# Patient Record
Sex: Male | Born: 1995 | Hispanic: No | Marital: Single | Smoking: Never smoker
Health system: Southern US, Community
[De-identification: ages and names within clinical notes are randomized; demographics above are authoritative.]

## PROBLEM LIST (undated history)

## (undated) DIAGNOSIS — G43909 Migraine, unspecified, not intractable, without status migrainosus: Secondary | ICD-10-CM

## (undated) DIAGNOSIS — D573 Sickle-cell trait: Secondary | ICD-10-CM

---
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
## 2018-01-07 ENCOUNTER — Emergency Department (HOSPITAL_COMMUNITY): Payer: Medicaid Other

## 2018-01-07 ENCOUNTER — Other Ambulatory Visit: Payer: Self-pay

## 2018-01-07 ENCOUNTER — Emergency Department (HOSPITAL_COMMUNITY)
Admission: EM | Admit: 2018-01-07 | Discharge: 2018-01-07 | Disposition: A | Discharge status: Home / Self Care | Payer: Medicaid Other | Attending: Emergency Medicine | Admitting: Emergency Medicine

## 2018-01-07 ENCOUNTER — Encounter (HOSPITAL_COMMUNITY): Payer: Self-pay | Admitting: Emergency Medicine

## 2018-01-07 DIAGNOSIS — D573 Sickle-cell trait: Secondary | ICD-10-CM | POA: Diagnosis not present

## 2018-01-07 DIAGNOSIS — Y9389 Activity, other specified: Secondary | ICD-10-CM | POA: Insufficient documentation

## 2018-01-07 DIAGNOSIS — Y998 Other external cause status: Secondary | ICD-10-CM | POA: Diagnosis not present

## 2018-01-07 DIAGNOSIS — S199XXA Unspecified injury of neck, initial encounter: Secondary | ICD-10-CM | POA: Diagnosis present

## 2018-01-07 DIAGNOSIS — J45909 Unspecified asthma, uncomplicated: Secondary | ICD-10-CM | POA: Insufficient documentation

## 2018-01-07 DIAGNOSIS — Y92410 Unspecified street and highway as the place of occurrence of the external cause: Secondary | ICD-10-CM | POA: Diagnosis not present

## 2018-01-07 DIAGNOSIS — S161XXA Strain of muscle, fascia and tendon at neck level, initial encounter: Secondary | ICD-10-CM | POA: Diagnosis not present

## 2018-01-07 MED ORDER — CYCLOBENZAPRINE HCL 10 MG PO TABS: 10.0000 mg | ORAL_TABLET | Freq: Three times a day (TID) | ORAL | 0 refills | Status: DC | PRN

## 2018-01-07 MED ORDER — CYCLOBENZAPRINE HCL 10 MG PO TABS
10.0000 mg | ORAL_TABLET | Freq: Once | ORAL | Status: AC
  Administered 2018-01-07: 10 mg via ORAL
  Filled 2018-01-07: qty 1

## 2018-01-07 MED ORDER — IBUPROFEN 800 MG PO TABS: 800.0000 mg | ORAL_TABLET | Freq: Three times a day (TID) | ORAL | 0 refills | Status: DC

## 2018-01-07 MED ORDER — IBUPROFEN 800 MG PO TABS
800.0000 mg | ORAL_TABLET | Freq: Once | ORAL | Status: AC
  Administered 2018-01-07: 800 mg via ORAL
  Filled 2018-01-07: qty 1
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
## 2018-01-07 NOTE — Discharge Instructions (Addendum)
Alternate ice and heat to your neck as needed.  Follow-up with your doctor for recheck in a few days if not improving
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
## 2018-02-25 ENCOUNTER — Encounter (HOSPITAL_COMMUNITY): Payer: Self-pay | Admitting: Emergency Medicine

## 2018-02-25 ENCOUNTER — Emergency Department (HOSPITAL_COMMUNITY)
Admission: EM | Admit: 2018-02-25 | Discharge: 2018-02-25 | Disposition: A | Discharge status: Home / Self Care | Payer: Medicaid Other | Attending: Emergency Medicine | Admitting: Emergency Medicine

## 2018-02-25 ENCOUNTER — Other Ambulatory Visit: Payer: Self-pay

## 2018-02-25 DIAGNOSIS — Z79899 Other long term (current) drug therapy: Secondary | ICD-10-CM | POA: Insufficient documentation

## 2018-02-25 DIAGNOSIS — J45909 Unspecified asthma, uncomplicated: Secondary | ICD-10-CM | POA: Diagnosis not present

## 2018-02-25 DIAGNOSIS — J111 Influenza due to unidentified influenza virus with other respiratory manifestations: Secondary | ICD-10-CM | POA: Insufficient documentation

## 2018-02-25 DIAGNOSIS — R51 Headache: Secondary | ICD-10-CM | POA: Diagnosis present

## 2018-02-25 HISTORY — DX: Migraine, unspecified, not intractable, without status migrainosus: G43.909

## 2018-02-25 MED ORDER — ONDANSETRON HCL 4 MG PO TABS
ORAL_TABLET | ORAL | Status: AC
  Filled 2018-02-25: qty 1

## 2018-02-25 MED ORDER — ONDANSETRON 4 MG PO TBDP: 4.0000 mg | ORAL_TABLET | Freq: Three times a day (TID) | ORAL | 0 refills | Status: DC | PRN

## 2018-02-25 MED ORDER — SODIUM CHLORIDE 0.9 % IV BOLUS
1000.0000 mL | Freq: Once | INTRAVENOUS | Status: AC
  Administered 2018-02-25: 1000 mL via INTRAVENOUS

## 2018-02-25 MED ORDER — ACETAMINOPHEN 325 MG PO TABS: 650.0000 mg | ORAL_TABLET | Freq: Once | ORAL | Status: DC | PRN

## 2018-02-25 MED ORDER — OSELTAMIVIR PHOSPHATE 75 MG PO CAPS: 75.0000 mg | ORAL_CAPSULE | Freq: Two times a day (BID) | ORAL | 0 refills | Status: DC

## 2018-02-25 MED ORDER — ACETAMINOPHEN 325 MG PO TABS
ORAL_TABLET | ORAL | Status: AC
  Administered 2018-02-25: 650 mg
  Filled 2018-02-25: qty 2

## 2018-02-25 MED ORDER — ONDANSETRON 4 MG PO TBDP: 4.0000 mg | ORAL_TABLET | Freq: Once | ORAL | Status: DC | PRN

## 2018-02-25 NOTE — ED Provider Notes (Signed)
[facility] EMERGENCY DEPARTMENT Provider Note   CSN: [id number] Arrival date & time: 02/25/18  1808     History   Chief Complaint Chief Complaint  Patient presents with  . Headache    HPI Luis Bowman is a 23 y.o. male.  HPI Patient presents with fever and chills.  Has had nausea and vomiting.  Dull sore throat at times.  Occasional cough.  No headache.  Otherwise healthy but does have sickle cell trait.  No abdominal pain.  Dry cough. Past Medical History:  Diagnosis Date  . Asthma   . Migraines   . Sickle cell trait (HCC)     There are no active problems to display for this patient.   Past Surgical History:  Procedure Laterality Date  . FOREIGN BODY REMOVAL  02/12/2011   Procedure: REMOVAL FOREIGN BODY EXTREMITY;  Surgeon: Darreld Mclean, MD;  Location: [facility];  Service: Orthopedics;  Laterality: Left;        Home Medications    Prior to Admission medications   Medication Sig Start Date End Date Taking? Authorizing Provider  albuterol (PROVENTIL HFA;VENTOLIN HFA) 108 (90 BASE) MCG/ACT inhaler Inhale 2 puffs into the lungs every 6 (six) hours as needed. Asthma/ bronchial asthma    [provider]  cyclobenzaprine (FLEXERIL) 10 MG tablet Take 1 tablet (10 mg total) by mouth 3 (three) times daily as needed. 01/07/18   Triplett, Tammy, PA-C  ibuprofen (ADVIL,MOTRIN) 800 MG tablet Take 1 tablet (800 mg total) by mouth 3 (three) times daily. 01/07/18   Triplett, Tammy, PA-C  ondansetron (ZOFRAN-ODT) 4 MG disintegrating tablet Take 1 tablet (4 mg total) by mouth every 8 (eight) hours as needed for nausea or vomiting. 02/25/18   Benjiman Core, MD  oseltamivir (TAMIFLU) 75 MG capsule Take 1 capsule (75 mg total) by mouth every 12 (twelve) hours. 02/25/18   Benjiman Core, MD    Family History Family History  Problem Relation Age of Onset  . Stroke Maternal Grandmother   . Heart disease Maternal Grandmother   . Hypertension Maternal Grandmother   .  Cancer Maternal Grandfather     Social History Social History   Tobacco Use  . Smoking status: Never Smoker  . Smokeless tobacco: Never Used  Substance Use Topics  . Alcohol use: No  . Drug use: No     Allergies   Patient has no known allergies.   Review of Systems Review of Systems  Constitutional: Positive for fever. Negative for diaphoresis.  HENT: Positive for congestion.   Respiratory: Positive for cough.   Cardiovascular: Negative for chest pain.  Gastrointestinal: Positive for nausea.  Genitourinary: Negative for flank pain.  Musculoskeletal: Positive for myalgias.  Neurological: Positive for headaches.  Hematological: Negative for adenopathy.  Psychiatric/Behavioral: Negative for confusion.     Physical Exam Updated Vital Signs BP 122/64 (BP Location: Left Arm)   Pulse 77   Temp 99.1 F (37.3 C) (Oral)   Resp 20   Ht 5\' 7"  (1.702 m)   Wt 93 kg   SpO2 97%   BMI 32.11 kg/m   Physical Exam HENT:     Head: Atraumatic.     Mouth/Throat:     Mouth: Mucous membranes are moist.  Neck:     Musculoskeletal: Neck supple.  Cardiovascular:     Rate and Rhythm: Normal rate.  Pulmonary:     Breath sounds: Normal breath sounds.  Abdominal:     Palpations: Abdomen is soft.  Musculoskeletal:  General: No tenderness.  Neurological:     Mental Status: He is alert.  Psychiatric:        Mood and Affect: Mood normal.      ED Treatments / Results  Labs (all labs ordered are listed, but only abnormal results are displayed) Labs Reviewed - No data to display  EKG None  Radiology No results found.  Procedures Procedures (including critical care time)  Medications Ordered in ED Medications  ondansetron (ZOFRAN) 4 MG tablet (has no administration in time range)  ondansetron (ZOFRAN-ODT) disintegrating tablet 4 mg (has no administration in time range)  acetaminophen (TYLENOL) tablet 650 mg (has no administration in time range)  acetaminophen  (TYLENOL) 325 MG tablet (650 mg  Given 02/25/18 1910)  sodium chloride 0.9 % bolus 1,000 mL (0 mLs Intravenous Stopped 02/25/18 2136)     Initial Impression / Assessment and Plan / ED Course  I have reviewed the triage vital signs and the nursing notes.  Pertinent labs & imaging results that were available during my care of the patient were reviewed by me and considered in my medical decision making (see chart for details).     Patient with flulike symptoms.  Likely flu.  Feels better after defervesced since and IV fluids.  Doubt meningitis.  Will discharge home.  Final Clinical Impressions(s) / ED Diagnoses   Final diagnoses:  Influenza    ED Discharge Orders         Ordered    ondansetron (ZOFRAN-ODT) 4 MG disintegrating tablet  Every 8 hours PRN     02/25/18 2128    oseltamivir (TAMIFLU) 75 MG capsule  Every 12 hours     02/25/18 2128           Benjiman CorePickering, Coreen Shippee, MD 02/25/18 2147

## 2018-02-25 NOTE — ED Notes (Signed)
Family at bedside. 

## 2018-02-25 NOTE — ED Notes (Signed)
Sprite given to patient.

## 2018-02-25 NOTE — ED Notes (Signed)
ED Provider at bedside. 

## 2018-02-25 NOTE — ED Triage Notes (Signed)
Pt reports vomiting and chills, and migraine for last night.
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
## 2024-01-07 ENCOUNTER — Ambulatory Visit
Admission: EM | Admit: 2024-01-07 | Discharge: 2024-01-07 | Disposition: A | Discharge status: Home / Self Care | Attending: Internal Medicine | Admitting: Internal Medicine

## 2024-01-07 DIAGNOSIS — L03039 Cellulitis of unspecified toe: Secondary | ICD-10-CM

## 2024-01-07 DIAGNOSIS — L600 Ingrowing nail: Secondary | ICD-10-CM | POA: Diagnosis not present

## 2024-01-07 MED ORDER — CEPHALEXIN 500 MG PO CAPS: 1000.0000 mg | ORAL_CAPSULE | Freq: Two times a day (BID) | ORAL | 0 refills | Status: AC

## 2024-01-07 NOTE — Discharge Instructions (Signed)
 Soak your foot for 15-20 minutes twice a day for 5 days in warm Epson salts bath.  If you develop worse swelling, pain or red streaking up your foot, go the ER.   You may take Tylenol  500- 1000 mg every 8 hours for pain, and in between this you may add Ibuprofen  800 mg every 8 hours.

## 2024-01-07 NOTE — ED Triage Notes (Signed)
 Pt c/o right toenail pain x 1 week. Using  hydrogen peroxide
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
